# Patient Record
Sex: Female | Born: 1965 | Race: Black or African American | Hispanic: No | Marital: Married | Smoking: Never smoker
Health system: Southern US, Community
[De-identification: ages and names within clinical notes are randomized; demographics above are authoritative.]

## PROBLEM LIST (undated history)

## (undated) DIAGNOSIS — D649 Anemia, unspecified: Secondary | ICD-10-CM

## (undated) DIAGNOSIS — K625 Hemorrhage of anus and rectum: Secondary | ICD-10-CM

## (undated) DIAGNOSIS — E559 Vitamin D deficiency, unspecified: Secondary | ICD-10-CM

## (undated) DIAGNOSIS — I10 Essential (primary) hypertension: Secondary | ICD-10-CM

## (undated) DIAGNOSIS — G43909 Migraine, unspecified, not intractable, without status migrainosus: Secondary | ICD-10-CM

## (undated) HISTORY — DX: Essential (primary) hypertension: I10

## (undated) HISTORY — DX: Hemorrhage of anus and rectum: K62.5

## (undated) HISTORY — DX: Migraine, unspecified, not intractable, without status migrainosus: G43.909

## (undated) HISTORY — PX: TUBAL LIGATION: SHX77

## (undated) HISTORY — PX: OOPHORECTOMY: SHX86

## (undated) HISTORY — DX: Vitamin D deficiency, unspecified: E55.9

## (undated) HISTORY — DX: Anemia, unspecified: D64.9

---

## 2010-07-15 ENCOUNTER — Encounter (INDEPENDENT_AMBULATORY_CARE_PROVIDER_SITE_OTHER): Payer: Self-pay | Admitting: *Deleted

## 2010-07-22 ENCOUNTER — Encounter (INDEPENDENT_AMBULATORY_CARE_PROVIDER_SITE_OTHER): Payer: Self-pay | Admitting: Obstetrics and Gynecology

## 2010-07-22 ENCOUNTER — Inpatient Hospital Stay (HOSPITAL_COMMUNITY)
Admission: RE | Admit: 2010-07-22 | Discharge: 2010-07-23 | Payer: Self-pay | Source: Home / Self Care | Admitting: Obstetrics and Gynecology

## 2010-07-23 ENCOUNTER — Encounter (INDEPENDENT_AMBULATORY_CARE_PROVIDER_SITE_OTHER): Payer: Self-pay | Admitting: *Deleted

## 2010-10-21 ENCOUNTER — Encounter (INDEPENDENT_AMBULATORY_CARE_PROVIDER_SITE_OTHER): Payer: Self-pay | Admitting: *Deleted

## 2010-10-28 NOTE — Letter (Signed)
Summary: New Patient letter  [facility]  [address] [place], [place] [address]   Phone: [phone]  Fax: [phone]       10/21/2010 MRN: [id number]  Kelsey Townsend [address] [place], [place]  [address]  Dear Ms. Yaffe,  Welcome to the Gastroenterology Division at [facility].    You are scheduled to see Dr.  Christella Hartigan on 11-10-10 at 9:15A.M. on the 3rd floor at [facility], [address]  We ask that you try to arrive at our office 15 minutes prior to your appointment time to allow for check-in.  We would like you to complete the enclosed self-administered evaluation form prior to your visit and bring it with you on the day of your appointment.  We will review it with you.  Also, please bring a complete list of all your medications or, if you prefer, bring the medication bottles and we will list them.  Please bring your insurance card so that we may make a copy of it.  If your insurance requires a referral to see a specialist, please bring your referral form from your primary care physician.  Co-payments are due at the time of your visit and may be paid by cash, check or credit card.     Your office visit will consist of a consult with your physician (includes a physical exam), any laboratory testing he/she may order, scheduling of any necessary diagnostic testing (e.g. x-ray, ultrasound, CT-scan), and scheduling of a procedure (e.g. Endoscopy, Colonoscopy) if required.  Please allow enough time on your schedule to allow for any/all of these possibilities.    If you cannot keep your appointment, please call [phone] to cancel or reschedule prior to your appointment date.  This allows [place] the opportunity to schedule an appointment for another patient in need of care.  If you do not cancel or reschedule by 5 p.m. the business day prior to your appointment date, you will be charged a $50.00 late cancellation/no-show fee.    Thank you for choosing  [facility] for your medical needs.  We appreciate the opportunity to care for you.  Please visit [place] at our website  to learn more about our practice.                     Sincerely,                                                             The Gastroenterology Division

## 2010-11-10 ENCOUNTER — Ambulatory Visit: Payer: Self-pay | Admitting: Gastroenterology

## 2010-11-11 LAB — CBC
Hemoglobin: 12.4 g/dL (ref 12.0–15.0)
MCHC: 33.6 g/dL (ref 30.0–36.0)
Platelets: 199 10*3/uL (ref 150–400)
RDW: 12.9 % (ref 11.5–15.5)
RDW: 13.1 % (ref 11.5–15.5)
WBC: 12.6 10*3/uL — ABNORMAL HIGH (ref 4.0–10.5)
WBC: 8.9 10*3/uL (ref 4.0–10.5)

## 2010-11-11 LAB — SURGICAL PCR SCREEN
MRSA, PCR: NEGATIVE
Staphylococcus aureus: NEGATIVE

## 2010-11-11 LAB — PREGNANCY, URINE: Preg Test, Ur: NEGATIVE

## 2011-10-12 ENCOUNTER — Ambulatory Visit (INDEPENDENT_AMBULATORY_CARE_PROVIDER_SITE_OTHER): Payer: BC Managed Care – PPO | Admitting: General Surgery

## 2011-10-12 ENCOUNTER — Encounter (INDEPENDENT_AMBULATORY_CARE_PROVIDER_SITE_OTHER): Payer: Self-pay | Admitting: General Surgery

## 2011-10-12 DIAGNOSIS — K649 Unspecified hemorrhoids: Secondary | ICD-10-CM

## 2011-10-12 DIAGNOSIS — K648 Other hemorrhoids: Secondary | ICD-10-CM | POA: Insufficient documentation

## 2011-10-12 NOTE — Patient Instructions (Addendum)
Specifically for you: Stool softener 2/day (colace or similar generic) Fiber supplement (benefiber or metamucil) 2-3 times/day Continue proctofoam Sitz baths. 8 glasses water/day  Hemorrhoids  Hemorrhoids are enlarged (dilated) veins around the rectum. There are 2 types of hemorrhoids, and the type of hemorrhoid is determined by its location. Internal hemorrhoids occur in the veins just inside the rectum.They are usually not painful, but they may bleed.However, they may poke through to the outside and become irritated and painful. External hemorrhoids involve the veins outside the anus and can be felt as a painful swelling or hard lump near the anus.They are often itchy and may crack and bleed. Sometimes clots will form in the veins. This makes them swollen and painful. These are called thrombosed hemorrhoids. CAUSES Causes of hemorrhoids include:  Pregnancy. This increases the pressure in the hemorrhoidal veins.   Constipation.   Straining to have a bowel movement.   Obesity.   Heavy lifting or other activity that caused you to strain.   TREATMENT Most of the time hemorrhoids improve in 1 to 2 weeks. However, if symptoms do not seem to be getting better or if you have a lot of rectal bleeding, your caregiver may perform a procedure to help make the hemorrhoids get smaller or remove them completely.Possible treatments include:  Rubber band ligation. A rubber band is placed at the base of the hemorrhoid to cut off the circulation.   Sclerotherapy. A chemical is injected to shrink the hemorrhoid. This is what we did today.  Hemorrhoidectomy. This is surgical removal of the hemorrhoid.   HOME CARE INSTRUCTIONS   Increase fiber in your diet. Ask your caregiver about using fiber supplements.   Drink enough water and fluids to keep your urine clear or pale yellow.   Exercise regularly.   Go to the bathroom when you have the urge to have a bowel movement. Do not wait.   Avoid  straining to have bowel movements.   Keep the anal area dry and clean.   Only take over-the-counter or prescription medicines for pain, discomfort, or fever as directed by your caregiver.   Take warm sitz baths for 20 to 30 minutes, 3 to 4 times per day.   If the hemorrhoids are very tender and swollen, place ice packs on the area as tolerated. Using ice packs between sitz baths may be helpful. Fill a plastic bag with ice. Place a towel between the bag of ice and your skin.   Medicated creams and suppositories may be used or applied as directed.   Do not use a donut-shaped pillow or sit on the toilet for long periods. This increases blood pooling and pain.   SEEK MEDICAL CARE IF:   You have increasing pain and swelling that is not controlled with your medicine.   You have uncontrolled bleeding.   You have difficulty or you are unable to have a bowel movement.   You have pain or inflammation outside the area of the hemorrhoids.   You have chills or an oral temperature above 102 F (38.9 C).

## 2011-10-12 NOTE — Progress Notes (Signed)
Chief Complaint  Patient presents with  . Hemorrhoids    HISTORY: Pt presents with around 1 year of bleeding per rectum with bowel movements.  Originally, these episodes seemed to be clustered around her menses, but now are with every 1-2 bowel movements.  She denies hard stools and does not spend a significant amount of time on the toilet.  She had a colonoscopy by Dr. Bosie Clos that was negative other than hemorrhoids.  She has been on proctofoam once/day since the scope (around 1 month).  She has tried to change her diet to eat more fruits and vegetables, and her stools are more frequent, but are small.  She denies pain, itching, burning, or other symptoms.    Past Medical History  Diagnosis Date  . Abdominal pain   . Rectal bleeding     Past Surgical History  Procedure Date  . Oophorectomy   . Tubal ligation     No current outpatient prescriptions on file.     Allergies no known allergies   No family history on file.   History   Social History  . Marital Status: Married    Spouse Name: N/A    Number of Children: N/A  . Years of Education: N/A   Social History Main Topics  . Smoking status: Current Everyday Smoker  . Smokeless tobacco: Not on file  . Alcohol Use: No  . Drug Use: No  . Sexually Active:     REVIEW OF SYSTEMS - PERTINENT POSITIVES ONLY: 12 point review of systems negative other than HPI and PMH.  EXAM: Filed Vitals:   10/12/11 1341  BP: 108/72  Pulse: 84  Temp: 98.6 F (37 C)  Resp: 16    Gen:  No acute distress.  Well nourished and well groomed.   Neurological: Alert and oriented to person, place, and time. Coordination normal.  Head: Normocephalic and atraumatic.  Eyes: Conjunctivae are normal. Pupils are equal, round, and reactive to light. No scleral icterus.  Cardiovascular: Normal rate,  intact distal pulses.   Respiratory: Effort normal.  No respiratory distress.  GI: Soft. Bowel sounds are normal. The abdomen is soft and  nontender.  There is no rebound and no guarding.  Rectal:  Circumferential internal hemorrhoids on anoscopy, worst on right side.  Few small external tags.  Injected circumferentially. Musculoskeletal: Normal range of motion. Extremities are nontender.  Skin: Skin is warm and dry. No rash noted. No diaphoresis. No erythema. No pallor. No clubbing, cyanosis, or edema.   Psychiatric: Normal mood and affect. Behavior is normal. Judgment and thought content normal.    LABORATORY RESULTS: CBC hct 36.8   COLONOSCOPY RESULTS: Internal hemorrhoids only.  Remainder of colon clear.   ASSESSMENT AND PLAN: Hemorrhoids, internal, with bleeding Injected circumferential internal hemorrhoids. Continue proctofoam Stool softeners Sitz baths Fiber supplements Follow up in 4-6 weeks.     Kelsey Diego MD Surgical Oncology, General and Endocrine Surgery [facility]    Visit Diagnoses: 1. Hemorrhoids     Primary Care Physician: Rudi Heap, MD, MD  GI:  Dr. Bosie Clos.

## 2011-10-12 NOTE — Assessment & Plan Note (Signed)
Injected circumferential internal hemorrhoids. Continue proctofoam Stool softeners Sitz baths Fiber supplements Follow up in 4-6 weeks.

## 2011-11-19 ENCOUNTER — Encounter (INDEPENDENT_AMBULATORY_CARE_PROVIDER_SITE_OTHER): Payer: Self-pay

## 2011-11-26 ENCOUNTER — Encounter (INDEPENDENT_AMBULATORY_CARE_PROVIDER_SITE_OTHER): Payer: Self-pay | Admitting: General Surgery

## 2011-11-26 ENCOUNTER — Ambulatory Visit (INDEPENDENT_AMBULATORY_CARE_PROVIDER_SITE_OTHER): Payer: BC Managed Care – PPO | Admitting: General Surgery

## 2011-11-26 VITALS — BP 124/82 | HR 70 | Temp 97.8°F | Resp 16 | Ht 64.0 in | Wt 159.6 lb

## 2011-11-26 DIAGNOSIS — K648 Other hemorrhoids: Secondary | ICD-10-CM

## 2011-11-26 NOTE — Patient Instructions (Signed)
Avoid constipation.  If any symptoms recur, start proctofoam immediately, do sitz baths, and be more aggressive with laxatives.

## 2011-11-26 NOTE — Assessment & Plan Note (Signed)
Clinically improved. Reviewed hemorrhoid instructions with pt.   Avoid constipation. Follow up as needed.

## 2011-11-26 NOTE — Progress Notes (Signed)
HISTORY: Pt comes back after being injected 6 weeks ago.  She is no longer having bleeding, but has been found to be anemic.  She is not having burning or itching.  She is not having any prolapse.     PERTINENT REVIEW OF SYSTEMS: O/w negative.     EXAM: Head: Normocephalic and atraumatic.  Eyes:  Conjunctivae are normal. Pupils are equal, round, and reactive to light. No scleral icterus.  Neck:  Normal range of motion.   Resp: No respiratory distress, normal effort. Anorectal:  One pedunculated hemorrhoid that is non inflamed on posterior left.  Anoscopy not performed.   Neurological: Alert and oriented to person, place, and time. Coordination normal.  Skin: Skin is warm and dry. No rash noted. No diaphoretic. No erythema. No pallor.  Psychiatric: Normal mood and affect. Normal behavior. Judgment and thought content normal.      ASSESSMENT AND PLAN:   Hemorrhoids, internal, with bleeding Clinically improved. Reviewed hemorrhoid instructions with pt.   Avoid constipation. Follow up as needed.         Maudry Diego, MD Surgical Oncology, General & Endocrine Surgery [facility]  Rudi Heap, MD, MD Ernestina Penna, MD

## 2011-12-09 ENCOUNTER — Encounter (HOSPITAL_COMMUNITY): Payer: Self-pay | Admitting: *Deleted

## 2011-12-09 DIAGNOSIS — R1031 Right lower quadrant pain: Secondary | ICD-10-CM | POA: Insufficient documentation

## 2011-12-09 DIAGNOSIS — F17200 Nicotine dependence, unspecified, uncomplicated: Secondary | ICD-10-CM | POA: Insufficient documentation

## 2011-12-09 DIAGNOSIS — R10813 Right lower quadrant abdominal tenderness: Secondary | ICD-10-CM | POA: Insufficient documentation

## 2011-12-09 DIAGNOSIS — D649 Anemia, unspecified: Secondary | ICD-10-CM | POA: Insufficient documentation

## 2011-12-09 NOTE — ED Notes (Signed)
Pain in right lower quadrant intermittent, states she has had frequent problems with abdominal pain

## 2011-12-10 ENCOUNTER — Emergency Department (HOSPITAL_COMMUNITY)
Admission: EM | Admit: 2011-12-10 | Discharge: 2011-12-10 | Disposition: A | Discharge status: Home / Self Care | Payer: BC Managed Care – PPO | Attending: Emergency Medicine | Admitting: Emergency Medicine

## 2011-12-10 ENCOUNTER — Emergency Department (HOSPITAL_COMMUNITY): Payer: BC Managed Care – PPO

## 2011-12-10 LAB — URINALYSIS, ROUTINE W REFLEX MICROSCOPIC
Ketones, ur: NEGATIVE mg/dL
Leukocytes, UA: NEGATIVE
Nitrite: NEGATIVE
Protein, ur: NEGATIVE mg/dL
Urobilinogen, UA: 0.2 mg/dL (ref 0.0–1.0)

## 2011-12-10 LAB — CBC
MCH: 28.5 pg (ref 26.0–34.0)
MCHC: 33.2 g/dL (ref 30.0–36.0)
RDW: 13.4 % (ref 11.5–15.5)

## 2011-12-10 LAB — URINE MICROSCOPIC-ADD ON

## 2011-12-10 MED ORDER — MORPHINE SULFATE 2 MG/ML IJ SOLN
2.0000 mg | Freq: Once | INTRAMUSCULAR | Status: AC
  Administered 2011-12-10: 2 mg via INTRAVENOUS
  Filled 2011-12-10: qty 1

## 2011-12-10 MED ORDER — ONDANSETRON HCL 4 MG/2ML IJ SOLN
4.0000 mg | Freq: Once | INTRAMUSCULAR | Status: AC
  Administered 2011-12-10: 4 mg via INTRAVENOUS
  Filled 2011-12-10: qty 2

## 2011-12-10 MED ORDER — SODIUM CHLORIDE 0.9 % IV SOLN
Freq: Once | INTRAVENOUS | Status: AC
  Administered 2011-12-10: 02:00:00 via INTRAVENOUS

## 2011-12-10 NOTE — ED Provider Notes (Signed)
History     CSN: [id number]  Arrival date & time 12/09/11  2126   First MD Initiated Contact with Patient 12/10/11 0043      Chief Complaint  Patient presents with  . Abdominal Pain    (Consider location/radiation/quality/duration/timing/severity/associated sxs/prior treatment) HPI Kelsey Townsend is a 46 y.o. female with a h/o anemia, chronic abdominal pain, who presents to the Emergency Department complaining of right lower quadrant pain that began today. Last BM was two days ago and normal. Denies fever, chills, nausea, vomiting.    PCP Dr. Christell Constant Past Medical History  Diagnosis Date  . Abdominal pain   . Rectal bleeding   . Hemorrhoids   . Anemia     Past Surgical History  Procedure Date  . Oophorectomy   . Tubal ligation     No family history on file.  History  Substance Use Topics  . Smoking status: Current Everyday Smoker  . Smokeless tobacco: Never Used  . Alcohol Use: No    OB History    Grav Para Term Preterm Abortions TAB SAB Ect Mult Living                  Review of Systems  Constitutional: Negative for fever.       10 Systems reviewed and are negative for acute change except as noted in the HPI.  HENT: Negative for congestion.   Eyes: Negative for discharge and redness.  Respiratory: Negative for cough and shortness of breath.   Cardiovascular: Negative for chest pain.  Gastrointestinal: Positive for abdominal pain. Negative for vomiting.  Musculoskeletal: Negative for back pain.  Skin: Negative for rash.  Neurological: Negative for syncope, numbness and headaches.  Psychiatric/Behavioral:       No behavior change.    Allergies  Review of patient's allergies indicates no known allergies.  Home Medications   Current Outpatient Rx  Name Route Sig Dispense Refill  . HEMOCYTE PLUS PO Oral Take by mouth daily.    . IRON PO Oral Take by mouth daily.      BP 150/96  Pulse 71  Temp(Src) 97.9 F (36.6 C) (Oral)  Resp 20  Ht 5\' 5"   (1.651 m)  Wt 160 lb (72.576 kg)  BMI 26.63 kg/m2  SpO2 100%  LMP 11/19/2011  Physical Exam  Nursing note and vitals reviewed. Constitutional:       Awake, alert, nontoxic appearance with baseline speech for patient.  HENT:  Head: Atraumatic.  Mouth/Throat: No oropharyngeal exudate.  Eyes: EOM are normal. Pupils are equal, round, and reactive to light. Right eye exhibits no discharge. Left eye exhibits no discharge.  Neck: Neck supple.  Cardiovascular: Normal rate and regular rhythm.   No murmur heard. Pulmonary/Chest: Effort normal and breath sounds normal. No stridor. No respiratory distress. She has no wheezes. She has no rales. She exhibits no tenderness.  Abdominal: Soft. Bowel sounds are normal. She exhibits no mass. There is tenderness. There is no rebound.       Mild right lower quadrant pain to palpation.  Musculoskeletal: She exhibits no tenderness.       Baseline ROM, moves extremities with no obvious new focal weakness.  Lymphadenopathy:    She has no cervical adenopathy.  Neurological:       Awake, alert, cooperative and aware of situation; motor strength bilaterally; sensation normal to light touch bilaterally; peripheral visual fields full to confrontation; no facial asymmetry; tongue midline; major cranial nerves appear intact; no pronator drift, normal finger  to nose bilaterally, baseline gait without new ataxia.  Skin: No rash noted.  Psychiatric: She has a normal mood and affect.    ED Course  Procedures (including critical care time)  Results for orders placed during the hospital encounter of 12/10/11  URINALYSIS, ROUTINE W REFLEX MICROSCOPIC      Component Value Range   Color, Urine STRAW (*) YELLOW    APPearance CLEAR  CLEAR    Specific Gravity, Urine 1.010  1.005 - 1.030    pH 6.0  5.0 - 8.0    Glucose, UA NEGATIVE  NEGATIVE (mg/dL)   Hgb urine dipstick LARGE (*) NEGATIVE    Bilirubin Urine NEGATIVE  NEGATIVE    Ketones, ur NEGATIVE  NEGATIVE (mg/dL)     Protein, ur NEGATIVE  NEGATIVE (mg/dL)   Urobilinogen, UA 0.2  0.0 - 1.0 (mg/dL)   Nitrite NEGATIVE  NEGATIVE    Leukocytes, UA NEGATIVE  NEGATIVE   PREGNANCY, URINE      Component Value Range   Preg Test, Ur NEGATIVE  NEGATIVE   CBC      Component Value Range   WBC 7.6  4.0 - 10.5 (K/uL)   RBC 4.00  3.87 - 5.11 (MIL/uL)   Hemoglobin 11.4 (*) 12.0 - 15.0 (g/dL)   HCT 85.0 (*) 27.7 - 46.0 (%)   MCV 85.8  78.0 - 100.0 (fL)   MCH 28.5  26.0 - 34.0 (pg)   MCHC 33.2  30.0 - 36.0 (g/dL)   RDW 41.2  87.8 - 67.6 (%)   Platelets 255  150 - 400 (K/uL)  URINE MICROSCOPIC-ADD ON      Component Value Range   Squamous Epithelial / LPF RARE  RARE    WBC, UA 0-2  <3 (WBC/hpf)   RBC / HPF 0-2  <3 (RBC/hpf)   Bacteria, UA RARE  RARE    Dg Abd Acute W/chest  12/10/2011  *RADIOLOGY REPORT*  Clinical Data: Right lower quadrant abdominal pain.  ACUTE ABDOMEN SERIES (ABDOMEN 2 VIEW & CHEST 1 VIEW)  Comparison: None.  Findings: The lungs are well-aerated and clear.  There is no evidence of focal opacification, pleural effusion or pneumothorax. The cardiomediastinal silhouette is within normal limits.  The visualized bowel gas pattern is unremarkable.  Scattered stool and air are seen within the colon; there is no evidence of small bowel dilatation to suggest obstruction.  No free intra-abdominal air is identified on the provided upright view.  No acute osseous abnormalities are seen; the sacroiliac joints are unremarkable in appearance.  IMPRESSION:  1.  Unremarkable bowel gas pattern; no free intra-abdominal air seen. 2.  No acute cardiopulmonary process identified.  Original Report Authenticated By: Tonia Ghent, M.D.     1. Abdominal pain       MDM  Patient with chronic abdominal pain who presents with RLQ pain. Labs with improving anemia on iron supplements. Acute abdominal series with no acute findings. Gas and stool in the colon. Patient given IVF, analgesic, antiemetic with improvement.  Pt  feels improved after observation and/or treatment in ED.Pt stable in ED with no significant deterioration in condition.The patient appears reasonably screened and/or stabilized for discharge and I doubt any other medical condition or other [facility] requiring further screening, evaluation, or treatment in the ED at this time prior to discharge.  MDM Reviewed: nursing note and vitals Interpretation: labs and x-ray           Nicoletta Dress. Colon Branch, MD 12/16/11 1148

## 2011-12-10 NOTE — Discharge Instructions (Signed)
Your blood count on the iron is better at 11.4. Your x-rays shows a lot of gas and stool. That is the most likely cause of your abdominal pain. He has a mild laxative like MiraLax to have several bowel movements. If you continue to have abdominal pain or develop fevers and chills he developed violent vomiting and diarrhea return to the emergency room or follow up with your doctor

## 2011-12-10 NOTE — ED Notes (Signed)
Patient resting comfortably in bed; family at bedside.

## 2014-05-23 ENCOUNTER — Other Ambulatory Visit: Payer: Self-pay | Admitting: Family

## 2014-05-23 DIAGNOSIS — K641 Second degree hemorrhoids: Secondary | ICD-10-CM

## 2014-05-23 MED ORDER — HYDROCORTISONE ACETATE 25 MG RE SUPP: 25.0000 mg | Freq: Two times a day (BID) | RECTAL | Status: DC

## 2014-06-04 ENCOUNTER — Ambulatory Visit: Payer: Self-pay | Admitting: Nurse Practitioner

## 2014-08-01 ENCOUNTER — Encounter (INDEPENDENT_AMBULATORY_CARE_PROVIDER_SITE_OTHER): Payer: Self-pay

## 2014-08-01 ENCOUNTER — Ambulatory Visit (INDEPENDENT_AMBULATORY_CARE_PROVIDER_SITE_OTHER): Payer: 59 | Admitting: Family Medicine

## 2014-08-01 ENCOUNTER — Encounter: Payer: Self-pay | Admitting: Family Medicine

## 2014-08-01 VITALS — BP 147/86 | HR 74 | Temp 98.0°F | Ht 64.0 in | Wt 164.0 lb

## 2014-08-01 DIAGNOSIS — Z0000 Encounter for general adult medical examination without abnormal findings: Secondary | ICD-10-CM

## 2014-08-01 DIAGNOSIS — R030 Elevated blood-pressure reading, without diagnosis of hypertension: Secondary | ICD-10-CM

## 2014-08-01 DIAGNOSIS — IMO0001 Reserved for inherently not codable concepts without codable children: Secondary | ICD-10-CM

## 2014-08-01 NOTE — Progress Notes (Signed)
   Subjective:    Patient ID: Kelsey Townsend, female    DOB: 09/12/1965, 48 y.o.   MRN: [id number]  HPI  48-year-old female here for physical exam. Her main concern is her blood pressure. She has purchased a wrist blood pressure instrument and typically takes her blood pressure in the morning. On average her pressures of been greater than 140/90. She does have a positive family history in her mother. Of note she had her recent prescription for vision changed still is unable to read 20/20 on the Snellen chart. This gives me some concern about diabetes.    Review of Systems  Constitutional: Negative.   HENT: Negative.   Eyes: Negative.   Respiratory: Negative.   Cardiovascular: Negative.   Gastrointestinal: Negative.   Endocrine: Negative.   Genitourinary: Negative.   Hematological: Negative.   Psychiatric/Behavioral: Negative.        Objective:   Physical Exam  Constitutional: She is oriented to person, place, and time. She appears well-developed and well-nourished.  Eyes: Conjunctivae and EOM are normal.  Neck: Normal range of motion. Neck supple.  Cardiovascular: Normal rate, regular rhythm and normal heart sounds.   Pulmonary/Chest: Effort normal and breath sounds normal.  Abdominal: Soft. Bowel sounds are normal.  Musculoskeletal: Normal range of motion.  Neurological: She is alert and oriented to person, place, and time. She has normal reflexes.  Skin: Skin is warm and dry.  Psychiatric: She has a normal mood and affect. Her behavior is normal. Thought content normal.   BP 147/86 mmHg  Pulse 74  Temp(Src) 98 F (36.7 C) (Oral)  Ht 5' 4" (1.626 m)  Wt 164 lb (74.39 kg)  BMI 28.14 kg/m2  LMP 07/18/2014       Assessment & Plan:  1. Annual physical exam  - CMP14+EGFR - Lipid panel - Thyroid Panel With TSH  2. Elevated blood pressure Does not want meds.  Will let her try behavior mod and wt loss and recheck 4 months - CMP14+EGFR - Lipid panel - Thyroid Panel  With TSH 

## 2014-08-01 NOTE — Patient Instructions (Addendum)
Continue current medications. Continue good therapeutic lifestyle changes which include good diet and exercise. Fall precautions discussed with patient. If an FOBT was given today- please return it to our front desk. If you are over 46 years old - you may need Prevnar 73 or the adult Pneumonia vaccine.  Flu Shots will be available at our office starting mid- September. Please call and schedule a FLU CLINIC APPOINTMENT.      Hypertension Hypertension, commonly called high blood pressure, is when the force of blood pumping through your arteries is too strong. Your arteries are the blood vessels that carry blood from your heart throughout your body. A blood pressure reading consists of a higher number over a lower number, such as 110/72. The higher number (systolic) is the pressure inside your arteries when your heart pumps. The lower number (diastolic) is the pressure inside your arteries when your heart relaxes. Ideally you want your blood pressure below 120/80. Hypertension forces your heart to work harder to pump blood. Your arteries may become narrow or stiff. Having hypertension puts you at risk for heart disease, stroke, and other problems.  RISK FACTORS Some risk factors for high blood pressure are controllable. Others are not.  Risk factors you cannot control include:   Race. You may be at higher risk if you are African American.  Age. Risk increases with age.  Gender. Men are at higher risk than women before age 61 years. After age 57, women are at higher risk than men. Risk factors you can control include:  Not getting enough exercise or physical activity.  Being overweight.  Getting too much fat, sugar, calories, or salt in your diet.  Drinking too much alcohol. SIGNS AND SYMPTOMS Hypertension does not usually cause signs or symptoms. Extremely high blood pressure (hypertensive crisis) may cause headache, anxiety, shortness of breath, and nosebleed. DIAGNOSIS  To check if  you have hypertension, your health care provider will measure your blood pressure while you are seated, with your arm held at the level of your heart. It should be measured at least twice using the same arm. Certain conditions can cause a difference in blood pressure between your right and left arms. A blood pressure reading that is higher than normal on one occasion does not mean that you need treatment. If one blood pressure reading is high, ask your health care provider about having it checked again. TREATMENT  Treating high blood pressure includes making lifestyle changes and possibly taking medicine. Living a healthy lifestyle can help lower high blood pressure. You may need to change some of your habits. Lifestyle changes may include:  Following the DASH diet. This diet is high in fruits, vegetables, and whole grains. It is low in salt, red meat, and added sugars.  Getting at least 2 hours of brisk physical activity every week.  Losing weight if necessary.  Not smoking.  Limiting alcoholic beverages.  Learning ways to reduce stress. If lifestyle changes are not enough to get your blood pressure under control, your health care provider may prescribe medicine. You may need to take more than one. Work closely with your health care provider to understand the risks and benefits. HOME CARE INSTRUCTIONS  Have your blood pressure rechecked as directed by your health care provider.   Take medicines only as directed by your health care provider. Follow the directions carefully. Blood pressure medicines must be taken as prescribed. The medicine does not work as well when you skip doses. Skipping doses also puts you  at risk for problems.   Do not smoke.   Monitor your blood pressure at home as directed by your health care provider. SEEK MEDICAL CARE IF:   You think you are having a reaction to medicines taken.  You have recurrent headaches or feel dizzy.  You have swelling in your  ankles.  You have trouble with your vision. SEEK IMMEDIATE MEDICAL CARE IF:  You develop a severe headache or confusion.  You have unusual weakness, numbness, or feel faint.  You have severe chest or abdominal pain.  You vomit repeatedly.  You have trouble breathing. MAKE SURE YOU:   Understand these instructions.  Will watch your condition.  Will get help right away if you are not doing well or get worse. Document Released: 08/17/2005 Document Revised: 01/01/2014 Document Reviewed: 06/09/2013 [facility] Patient Information 2015 [place], [place]. This information is not intended to replace advice given to you by your health care provider. Make sure you discuss any questions you have with your health care provider.         DASH Eating Plan DASH stands for "Dietary Approaches to Stop Hypertension." The DASH eating plan is a healthy eating plan that has been shown to reduce high blood pressure (hypertension). Additional health benefits may include reducing the risk of type 2 diabetes mellitus, heart disease, and stroke. The DASH eating plan may also help with weight loss. WHAT DO I NEED TO KNOW ABOUT THE DASH EATING PLAN? For the DASH eating plan, you will follow these general guidelines:  Choose foods with a percent daily value for sodium of less than 5% (as listed on the food label).  Use salt-free seasonings or herbs instead of table salt or sea salt.  Check with your health care provider or pharmacist before using salt substitutes.  Eat lower-sodium products, often labeled as "lower sodium" or "no salt added."  Eat fresh foods.  Eat more vegetables, fruits, and low-fat dairy products.  Choose whole grains. Look for the word "whole" as the first word in the ingredient list.  Choose fish and skinless chicken or [place] more often than red meat. Limit fish, poultry, and meat to 6 oz (170 g) each day.  Limit sweets, desserts, sugars, and sugary drinks.  Choose  heart-healthy fats.  Limit cheese to 1 oz (28 g) per day.  Eat more home-cooked food and less restaurant, buffet, and fast food.  Limit fried foods.  Cook foods using methods other than frying.  Limit canned vegetables. If you do use them, rinse them well to decrease the sodium.  When eating at a restaurant, ask that your food be prepared with less salt, or no salt if possible. WHAT FOODS CAN I EAT? Seek help from a dietitian for individual calorie needs. Grains Whole grain or whole wheat bread. Brown rice. Whole grain or whole wheat pasta. Quinoa, bulgur, and whole grain cereals. Low-sodium cereals. Corn or whole wheat flour tortillas. Whole grain cornbread. Whole grain crackers. Low-sodium crackers. Vegetables Fresh or frozen vegetables (raw, steamed, roasted, or grilled). Low-sodium or reduced-sodium tomato and vegetable juices. Low-sodium or reduced-sodium tomato sauce and paste. Low-sodium or reduced-sodium canned vegetables.  Fruits All fresh, canned (in natural juice), or frozen fruits. Meat and Other Protein Products Ground beef (85% or leaner), grass-fed beef, or beef trimmed of fat. Skinless chicken or [place]. Ground chicken or [place]. Pork trimmed of fat. All fish and seafood. Eggs. Dried beans, peas, or lentils. Unsalted nuts and seeds. Unsalted canned beans. Dairy Low-fat dairy products, such  as skim or 1% milk, 2% or reduced-fat cheeses, low-fat ricotta or cottage cheese, or plain low-fat yogurt. Low-sodium or reduced-sodium cheeses. Fats and Oils Tub margarines without trans fats. Light or reduced-fat mayonnaise and salad dressings (reduced sodium). Avocado. Safflower, olive, or canola oils. Natural peanut or almond butter. Other Unsalted popcorn and pretzels. The items listed above may not be a complete list of recommended foods or beverages. Contact your dietitian for more options. WHAT FOODS ARE NOT RECOMMENDED? Grains White bread. White pasta. White rice. Refined  cornbread. Bagels and croissants. Crackers that contain trans fat. Vegetables Creamed or fried vegetables. Vegetables in a cheese sauce. Regular canned vegetables. Regular canned tomato sauce and paste. Regular tomato and vegetable juices. Fruits Dried fruits. Canned fruit in light or heavy syrup. Fruit juice. Meat and Other Protein Products Fatty cuts of meat. Ribs, chicken wings, bacon, sausage, bologna, salami, chitterlings, fatback, hot dogs, bratwurst, and packaged luncheon meats. Salted nuts and seeds. Canned beans with salt. Dairy Whole or 2% milk, cream, half-and-half, and cream cheese. Whole-fat or sweetened yogurt. Full-fat cheeses or blue cheese. Nondairy creamers and whipped toppings. Processed cheese, cheese spreads, or cheese curds. Condiments Onion and garlic salt, seasoned salt, table salt, and sea salt. Canned and packaged gravies. Worcestershire sauce. Tartar sauce. Barbecue sauce. Teriyaki sauce. Soy sauce, including reduced sodium. Steak sauce. Fish sauce. Oyster sauce. Cocktail sauce. Horseradish. Ketchup and mustard. Meat flavorings and tenderizers. Bouillon cubes. Hot sauce. Tabasco sauce. Marinades. Taco seasonings. Relishes. Fats and Oils Butter, stick margarine, lard, shortening, ghee, and bacon fat. Coconut, palm kernel, or palm oils. Regular salad dressings. Other Pickles and olives. Salted popcorn and pretzels. The items listed above may not be a complete list of foods and beverages to avoid. Contact your dietitian for more information. WHERE CAN I FIND MORE INFORMATION? National [employer], [employer], [employer] Institute: travelstabloid.com Document Released: 08/06/2011 Document Revised: 01/01/2014 Document Reviewed: 06/21/2013 [facility] Patient Information 2015 [place], [place]. This information is not intended to replace advice given to you by your health care provider. Make sure you discuss any questions you have with your health care  provider.

## 2014-08-02 LAB — CMP14+EGFR
ALT: 16 IU/L (ref 0–32)
AST: 17 IU/L (ref 0–40)
Albumin/Globulin Ratio: 1.5 (ref 1.1–2.5)
Albumin: 4.5 g/dL (ref 3.5–5.5)
Alkaline Phosphatase: 87 IU/L (ref 39–117)
BUN/Creatinine Ratio: 13 (ref 9–23)
BUN: 12 mg/dL (ref 6–24)
CALCIUM: 9.7 mg/dL (ref 8.7–10.2)
CO2: 27 mmol/L (ref 18–29)
CREATININE: 0.92 mg/dL (ref 0.57–1.00)
Chloride: 103 mmol/L (ref 97–108)
GFR calc Af Amer: 86 mL/min/{1.73_m2} (ref >59)
GFR calc non Af Amer: 75 mL/min/{1.73_m2} (ref >59)
GLOBULIN, TOTAL: 3 g/dL (ref 1.5–4.5)
GLUCOSE: 85 mg/dL (ref 65–99)
POTASSIUM: 4.5 mmol/L (ref 3.5–5.2)
SODIUM: 143 mmol/L (ref 134–144)
Total Bilirubin: 0.4 mg/dL (ref 0.0–1.2)
Total Protein: 7.5 g/dL (ref 6.0–8.5)

## 2014-08-02 LAB — THYROID PANEL WITH TSH
FREE THYROXINE INDEX: 2.4 (ref 1.2–4.9)
T3 Uptake Ratio: 26 % (ref 24–39)
T4, Total: 9.3 ug/dL (ref 4.5–12.0)
TSH: 0.513 u[IU]/mL (ref 0.450–4.500)

## 2014-08-02 LAB — LIPID PANEL
Chol/HDL Ratio: 3.1 ratio units (ref 0.0–4.4)
Cholesterol, Total: 169 mg/dL (ref 100–199)
HDL: 55 mg/dL (ref >39)
LDL Calculated: 102 mg/dL — ABNORMAL HIGH (ref 0–99)
TRIGLYCERIDES: 61 mg/dL (ref 0–149)
VLDL Cholesterol Cal: 12 mg/dL (ref 5–40)

## 2014-09-18 ENCOUNTER — Encounter: Payer: Self-pay | Admitting: Family Medicine

## 2014-09-18 ENCOUNTER — Ambulatory Visit (INDEPENDENT_AMBULATORY_CARE_PROVIDER_SITE_OTHER): Payer: 59 | Admitting: Family Medicine

## 2014-09-18 VITALS — BP 161/102 | HR 100 | Temp 97.4°F | Ht 64.0 in | Wt 165.0 lb

## 2014-09-18 DIAGNOSIS — G44209 Tension-type headache, unspecified, not intractable: Secondary | ICD-10-CM

## 2014-09-18 MED ORDER — BUTALBITAL-APAP-CAFFEINE 50-325-40 MG PO TABS
1.0000 | ORAL_TABLET | Freq: Four times a day (QID) | ORAL | Status: AC | PRN
Start: 2014-09-18 — End: 2015-09-18

## 2014-09-18 NOTE — Progress Notes (Signed)
   Subjective:    Patient ID: Kelsey Townsend, female    DOB: 11/12/1965, 49 y.o.   MRN: [id number]  HPI Patient is having severe headache that is wrapped around her head initially and now is more in the frontal region and is pulsatile and severe.  She has phonophotophobia and nausea.  Review of Systems  Constitutional: Negative for fever.  HENT: Negative for ear pain.   Eyes: Negative for discharge.  Respiratory: Negative for cough.   Cardiovascular: Negative for chest pain.  Gastrointestinal: Negative for abdominal distention.  Endocrine: Negative for polyuria.  Genitourinary: Negative for difficulty urinating.  Musculoskeletal: Negative for gait problem and neck pain.  Skin: Negative for color change and rash.  Neurological: Negative for speech difficulty and headaches.  Psychiatric/Behavioral: Negative for agitation.       Objective:    BP 161/102 mmHg  Pulse 100  Temp(Src) 97.4 F (36.3 C) (Oral)  Ht 5\' 4"  (1.626 m)  Wt 165 lb (74.844 kg)  BMI 28.31 kg/m2  LMP 07/19/2014 Physical Exam  Constitutional: She is oriented to person, place, and time. She appears well-developed and well-nourished.  HENT:  Head: Normocephalic and atraumatic.  Mouth/Throat: Oropharynx is clear and moist.  Eyes: Pupils are equal, round, and reactive to light.  Neck: Normal range of motion. Neck supple.  Cardiovascular: Normal rate and regular rhythm.   No murmur heard. Pulmonary/Chest: Effort normal and breath sounds normal.  Abdominal: Soft. Bowel sounds are normal. There is no tenderness.  Neurological: She is alert and oriented to person, place, and time.  Skin: Skin is warm and dry.  Psychiatric: She has a normal mood and affect.          Assessment & Plan:     ICD-9-CM ICD-10-CM   1. Tension-type headache, not intractable, unspecified chronicity pattern 339.10 G44.209 butalbital-acetaminophen-caffeine (FIORICET) 50-325-40 MG per tablet   Follow up if not better.  Follow up  and get BP checked.  No Follow-up on file.  Lysbeth Penner

## 2014-10-12 ENCOUNTER — Telehealth: Payer: Self-pay | Admitting: Family Medicine

## 2014-10-12 NOTE — Telephone Encounter (Signed)
Patient's blood pressure is still elevated. She wanted to know if she can come in sooner than her scheduled for 4 month f/u. Moved appt up. Advised patient to keep a log of her blood pressure and to follow up sooner if necessary.

## 2014-10-22 ENCOUNTER — Telehealth: Payer: Self-pay | Admitting: Family Medicine

## 2014-10-25 NOTE — Telephone Encounter (Signed)
Begin amlodipine 5 mg qd #30

## 2014-10-26 ENCOUNTER — Ambulatory Visit (INDEPENDENT_AMBULATORY_CARE_PROVIDER_SITE_OTHER): Payer: 59 | Admitting: Family Medicine

## 2014-10-26 ENCOUNTER — Encounter (INDEPENDENT_AMBULATORY_CARE_PROVIDER_SITE_OTHER): Payer: Self-pay

## 2014-10-26 ENCOUNTER — Encounter: Payer: Self-pay | Admitting: Family Medicine

## 2014-10-26 VITALS — BP 150/91 | HR 92 | Temp 97.0°F | Ht 64.0 in | Wt 165.0 lb

## 2014-10-26 DIAGNOSIS — G43909 Migraine, unspecified, not intractable, without status migrainosus: Secondary | ICD-10-CM | POA: Insufficient documentation

## 2014-10-26 DIAGNOSIS — G43119 Migraine with aura, intractable, without status migrainosus: Secondary | ICD-10-CM

## 2014-10-26 MED ORDER — LOSARTAN POTASSIUM-HCTZ 50-12.5 MG PO TABS: 1.0000 | ORAL_TABLET | Freq: Every day | ORAL | Status: DC

## 2014-10-26 NOTE — Progress Notes (Signed)
   Subjective:    Patient ID: Kelsey Townsend, female    DOB: 12/17/1965, 49 y.o.   MRN: [id number]  HPI  49 year old African-American female with headaches and hypertension. At her last visit hypertension was noted but we mutually agreed to hold off treatment and let her try behavior modification lifestyle changes to lower pressure. Since that time she has gone to the emergency room with headache and was told her blood pressure was elevated. MRI at the emergency room was negative and she was given Fioricet for headache and told to follow-up regarding her blood pressure. As is always the case is hard to know which comes first headache or high blood pressure.  She denies any decrease increase in stress although she does admit to some strife at work with other women. This may or may not be related to her headaches. Other relationships are good. Sounds like she may be menopausal having not had a period in 6 months and having some hot feelings.  She is still reluctant to take any medication chronically stating that she cannot remember to take meds on a regular basis.  Patient Active Problem List   Diagnosis Date Noted  . Migraines   . Hemorrhoids, internal, with bleeding 10/12/2011   Outpatient Encounter Prescriptions as of 10/26/2014  Medication Sig  . butalbital-acetaminophen-caffeine (FIORICET) 50-325-40 MG per tablet Take 1-2 tablets by mouth every 6 (six) hours as needed for headache.     Review of Systems  Constitutional: Negative.   Eyes: Negative.   Respiratory: Negative.   Cardiovascular: Negative.   Gastrointestinal: Negative.   Endocrine: Negative.   Genitourinary: Negative.   Neurological: Positive for headaches.  Hematological: Negative.   Psychiatric/Behavioral: Negative.        Objective:   Physical Exam  Constitutional: She is oriented to person, place, and time.  Cardiovascular: Normal rate and regular rhythm.   Pulmonary/Chest: Effort normal and breath sounds  normal.  Neurological: She is alert and oriented to person, place, and time. She has normal reflexes.  Psychiatric: She has a normal mood and affect. Her behavior is normal. Judgment and thought content normal.    BP 150/91 mmHg  Pulse 92  Temp(Src) 97 F (36.1 C) (Oral)  Ht 5\' 4"  (1.626 m)  Wt 165 lb (74.844 kg)  BMI 28.31 kg/m2        Assessment & Plan:  1. Intractable migraine with aura without status migrainosus Will treat BP.  Pt does not seem happy to begin treatment.  She asks why BP has gone up now. Plan: Hyzaar, 50/12.5 Recheck in 2-3 weeks  Wardell Honour MD

## 2014-10-29 NOTE — Telephone Encounter (Signed)
Stp, she is aware and has already started taking the amlodipine. She has follow up appt in 2-3 weeks with Dr.Miller.

## 2014-11-16 ENCOUNTER — Encounter: Payer: Self-pay | Admitting: Family Medicine

## 2014-11-16 ENCOUNTER — Ambulatory Visit (INDEPENDENT_AMBULATORY_CARE_PROVIDER_SITE_OTHER): Payer: 59 | Admitting: Family Medicine

## 2014-11-16 VITALS — BP 114/66 | HR 90 | Temp 98.6°F | Ht 64.0 in | Wt 163.0 lb

## 2014-11-16 DIAGNOSIS — I10 Essential (primary) hypertension: Secondary | ICD-10-CM | POA: Insufficient documentation

## 2014-11-16 DIAGNOSIS — G43119 Migraine with aura, intractable, without status migrainosus: Secondary | ICD-10-CM

## 2014-11-16 MED ORDER — LOSARTAN POTASSIUM-HCTZ 50-12.5 MG PO TABS: 1.0000 | ORAL_TABLET | Freq: Every day | ORAL | Status: DC

## 2014-11-16 NOTE — Progress Notes (Signed)
   Subjective:    Patient ID: Kelsey Townsend, female    DOB: Aug 15, 1966, 49 y.o.   MRN: [id number]  HPI patient has responded nicely to losartan and hydrochlorothiazide. Blood pressure is perfect today and she has had no further headache. There are still issues at work but without the headache she is better able to deal with the personalities that create work place problems.  She is also complaining of knee pain. There is no history of injury. She has lots of steps that she goes up and down over the course of the day. There is some swelling.  Patient Active Problem List   Diagnosis Date Noted  . Hypertension   . Migraines   . Hemorrhoids, internal, with bleeding 10/12/2011   Outpatient Encounter Prescriptions as of 11/16/2014  Medication Sig  . butalbital-acetaminophen-caffeine (FIORICET) 50-325-40 MG per tablet Take 1-2 tablets by mouth every 6 (six) hours as needed for headache.  . losartan-hydrochlorothiazide (HYZAAR) 50-12.5 MG per tablet Take 1 tablet by mouth daily.      Review of Systems  Musculoskeletal: Positive for arthralgias.  Neurological: Positive for headaches.       Objective:   Physical Exam  Constitutional: She is oriented to person, place, and time. She appears well-developed and well-nourished.  Cardiovascular: Normal rate and regular rhythm.   Pulmonary/Chest: Effort normal.  Musculoskeletal:  Knee exam: There is some swelling in the right knee. Knees are stable to stress test. There is a slight bit of crepitance with extension and flexion  Neurological: She is alert and oriented to person, place, and time.     BP 114/66 mmHg  Pulse 90  Temp(Src) 98.6 F (37 C) (Oral)  Ht 5\' 4"  (1.626 m)  Wt 163 lb (73.936 kg)  BMI 27.97 kg/m2      Assessment & Plan:  1. Intractable migraine with aura without status migrainosus No further headache since antihypertensive medicines started. This makes me think her headaches were related to the blood pressure  possibly.  2. Hypertension Continue with Hyzaar. As well tolerated and her blood pressure is great  Wardell Honour MD

## 2014-11-22 ENCOUNTER — Ambulatory Visit: Payer: 59 | Admitting: Family Medicine

## 2015-05-21 ENCOUNTER — Ambulatory Visit (INDEPENDENT_AMBULATORY_CARE_PROVIDER_SITE_OTHER): Payer: 59 | Admitting: Family Medicine

## 2015-05-21 ENCOUNTER — Encounter: Payer: Self-pay | Admitting: Family Medicine

## 2015-05-21 VITALS — BP 150/89 | HR 84 | Temp 98.0°F | Ht 64.0 in | Wt 165.0 lb

## 2015-05-21 DIAGNOSIS — D239 Other benign neoplasm of skin, unspecified: Secondary | ICD-10-CM | POA: Insufficient documentation

## 2015-05-21 DIAGNOSIS — I10 Essential (primary) hypertension: Secondary | ICD-10-CM

## 2015-05-21 MED ORDER — VALSARTAN-HYDROCHLOROTHIAZIDE 160-12.5 MG PO TABS: 1.0000 | ORAL_TABLET | Freq: Every day | ORAL | Status: DC

## 2015-05-21 NOTE — Patient Instructions (Signed)
Monitor your blood pressure at home. Call if blood pressure is staying higher than 130/80  Low-Sodium Eating Plan Sodium raises blood pressure and causes water to be held in the body. Getting less sodium from food will help lower your blood pressure, reduce any swelling, and protect your heart, liver, and kidneys. We get sodium by adding salt (sodium chloride) to food. Most of our sodium comes from canned, boxed, and frozen foods. Restaurant foods, fast foods, and pizza are also very high in sodium. Even if you take medicine to lower your blood pressure or to reduce fluid in your body, getting less sodium from your food is important. WHAT IS MY PLAN? Most people should limit their sodium intake to 2,300 mg a day. Your health care provider recommends that you limit your sodium intake to __________ a day.  WHAT DO I NEED TO KNOW ABOUT THIS EATING PLAN? For the low-sodium eating plan, you will follow these general guidelines:  Choose foods with a % Daily Value for sodium of less than 5% (as listed on the food label).   Use salt-free seasonings or herbs instead of table salt or sea salt.   Check with your health care provider or pharmacist before using salt substitutes.   Eat fresh foods.  Eat more vegetables and fruits.  Limit canned vegetables. If you do use them, rinse them well to decrease the sodium.   Limit cheese to 1 oz (28 g) per day.   Eat lower-sodium products, often labeled as "lower sodium" or "no salt added."  Avoid foods that contain monosodium glutamate (MSG). MSG is sometimes added to [place] food and some canned foods.  Check food labels (Nutrition Facts labels) on foods to learn how much sodium is in one serving.  Eat more home-cooked food and less restaurant, buffet, and fast food.  When eating at a restaurant, ask that your food be prepared with less salt or none, if possible.  HOW DO I READ FOOD LABELS FOR SODIUM INFORMATION? The Nutrition Facts label  lists the amount of sodium in one serving of the food. If you eat more than one serving, you must multiply the listed amount of sodium by the number of servings. Food labels may also identify foods as:  Sodium free--Less than 5 mg in a serving.  Very low sodium--35 mg or less in a serving.  Low sodium--140 mg or less in a serving.  Light in sodium--50% less sodium in a serving. For example, if a food that usually has 300 mg of sodium is changed to become light in sodium, it will have 150 mg of sodium.  Reduced sodium--25% less sodium in a serving. For example, if a food that usually has 400 mg of sodium is changed to reduced sodium, it will have 300 mg of sodium. WHAT FOODS CAN I EAT? Grains Low-sodium cereals, including oats, puffed wheat and rice, and shredded wheat cereals. Low-sodium crackers. Unsalted rice and pasta. Lower-sodium bread.  Vegetables Frozen or fresh vegetables. Low-sodium or reduced-sodium canned vegetables. Low-sodium or reduced-sodium tomato sauce and paste. Low-sodium or reduced-sodium tomato and vegetable juices.  Fruits Fresh, frozen, and canned fruit. Fruit juice.  Meat and Other Protein Products Low-sodium canned tuna and salmon. Fresh or frozen meat, poultry, seafood, and fish. Lamb. Unsalted nuts. Dried beans, peas, and lentils without added salt. Unsalted canned beans. Homemade soups without salt. Eggs.  Dairy Milk. Soy milk. Ricotta cheese. Low-sodium or reduced-sodium cheeses. Yogurt.  Condiments Fresh and dried herbs and spices. Salt-free seasonings.  Onion and garlic powders. Low-sodium varieties of mustard and ketchup. Lemon juice.  Fats and Oils Reduced-sodium salad dressings. Unsalted butter.  Other Unsalted popcorn and pretzels.  The items listed above may not be a complete list of recommended foods or beverages. Contact your dietitian for more options. WHAT FOODS ARE NOT RECOMMENDED? Grains Instant hot cereals. Bread stuffing, pancake,  and biscuit mixes. Croutons. Seasoned rice or pasta mixes. Noodle soup cups. Boxed or frozen macaroni and cheese. Self-rising flour. Regular salted crackers. Vegetables Regular canned vegetables. Regular canned tomato sauce and paste. Regular tomato and vegetable juices. Frozen vegetables in sauces. Salted french fries. Olives. Angie Fava. Relishes. Sauerkraut. Salsa. Meat and Other Protein Products Salted, canned, smoked, spiced, or pickled meats, seafood, or fish. Bacon, ham, sausage, hot dogs, corned beef, chipped beef, and packaged luncheon meats. Salt pork. Jerky. Pickled herring. Anchovies, regular canned tuna, and sardines. Salted nuts. Dairy Processed cheese and cheese spreads. Cheese curds. Blue cheese and cottage cheese. Buttermilk.  Condiments Onion and garlic salt, seasoned salt, table salt, and sea salt. Canned and packaged gravies. Worcestershire sauce. Tartar sauce. Barbecue sauce. Teriyaki sauce. Soy sauce, including reduced sodium. Steak sauce. Fish sauce. Oyster sauce. Cocktail sauce. Horseradish. Regular ketchup and mustard. Meat flavorings and tenderizers. Bouillon cubes. Hot sauce. Tabasco sauce. Marinades. Taco seasonings. Relishes. Fats and Oils Regular salad dressings. Salted butter. Margarine. Ghee. Bacon fat.  Other Potato and tortilla chips. Corn chips and puffs. Salted popcorn and pretzels. Canned or dried soups. Pizza. Frozen entrees and pot pies.  The items listed above may not be a complete list of foods and beverages to avoid. Contact your dietitian for more information. Document Released: 02/06/2002 Document Revised: 08/22/2013 Document Reviewed: 06/21/2013 [facility] Patient Information 2015 [place], [place]. This information is not intended to replace advice given to you by your health care provider. Make sure you discuss any questions you have with your health care provider.

## 2015-05-21 NOTE — Progress Notes (Signed)
   Subjective:    Patient ID: Kelsey Townsend, female    DOB: 1966/05/18, 49 y.o.   MRN: [id number]  HPI  67-year-old female with hypertension. She is here today for routine follow-up blood pressure here is 150/89. When she checks it at home with her wrist instrument pressures are always between 462 and 7:03 systolic and below 80 diastolic. She admits that she sometimes forgets to take her pills. Also, she is on losartan which is probably not a true 24 hour pill.  Patient Active Problem List   Diagnosis Date Noted  . Hypertension   . Migraines   . Hemorrhoids, internal, with bleeding 10/12/2011   Outpatient Encounter Prescriptions as of 05/21/2015  Medication Sig  . butalbital-acetaminophen-caffeine (FIORICET) 50-325-40 MG per tablet Take 1-2 tablets by mouth every 6 (six) hours as needed for headache.  . losartan-hydrochlorothiazide (HYZAAR) 50-12.5 MG per tablet Take 1 tablet by mouth daily.   No facility-administered encounter medications on file as of 05/21/2015.       Review of Systems  Constitutional: Negative.   HENT: Negative.   Eyes: Negative.   Respiratory: Negative.   Cardiovascular: Negative.   Gastrointestinal: Negative.   Endocrine: Negative.   Genitourinary: Negative.   Hematological: Negative.   Psychiatric/Behavioral: Negative.        Objective:   Physical Exam  Constitutional: She is oriented to person, place, and time. She appears well-developed and well-nourished.  Cardiovascular: Normal rate and regular rhythm.   Pulmonary/Chest: Effort normal and breath sounds normal.  Neurological: She is alert and oriented to person, place, and time.  Skin:  Skin papilloma on left anterior shoulder patient would like to have removed. This was anesthetized with 1% Xylocaine skin prepped with Betadine and with sharp scissor dissection removed. Hemostasis obtained with Monsel solution     BP 150/89 mmHg  Pulse 84  Temp(Src) 98 F (36.7 C) (Oral)  Ht 5\' 4"  (1.626  m)  Wt 165 lb (74.844 kg)  BMI 28.31 kg/m2      Assessment & Plan:   1. Essential hypertension Blood pressures have been okay at home but my preference is to move her to a 24 hour pill. After current losartan runs out begin valsartan 120 mg and hydrochlorothiazide 12.5 and continue to monitor blood pressure  2. Benign skin papilloma Removed with sharp dissection as stated above.

## 2015-08-23 LAB — HM MAMMOGRAPHY

## 2015-10-31 ENCOUNTER — Other Ambulatory Visit: Payer: Self-pay | Admitting: Family Medicine

## 2015-11-13 ENCOUNTER — Telehealth: Payer: Self-pay | Admitting: Family Medicine

## 2015-11-13 NOTE — Telephone Encounter (Signed)
Patient wants to know if you could do a cortisone shot in her right knee. Please advise

## 2015-11-14 ENCOUNTER — Encounter: Payer: Self-pay | Admitting: Family Medicine

## 2015-11-14 ENCOUNTER — Ambulatory Visit (INDEPENDENT_AMBULATORY_CARE_PROVIDER_SITE_OTHER): Payer: BLUE CROSS/BLUE SHIELD | Admitting: Family Medicine

## 2015-11-14 VITALS — BP 137/82 | HR 65 | Temp 97.1°F | Ht 64.0 in | Wt 166.0 lb

## 2015-11-14 DIAGNOSIS — M179 Osteoarthritis of knee, unspecified: Secondary | ICD-10-CM | POA: Diagnosis not present

## 2015-11-14 DIAGNOSIS — M1711 Unilateral primary osteoarthritis, right knee: Secondary | ICD-10-CM | POA: Insufficient documentation

## 2015-11-14 NOTE — Patient Instructions (Signed)
Thank you for allowing us to care for you today. We strive to provide exceptional quality and compassionate care. Please let us know how we are doing and how we can help serve you better by filling out the survey that you receive from [employer].     

## 2015-11-14 NOTE — Telephone Encounter (Signed)
Yes we can do shots here but it needs to be at least 3 or 4 months since she received her last one

## 2015-11-14 NOTE — Telephone Encounter (Signed)
Patients last one was 6 mnths ago appointment given for 10:45 today.

## 2015-11-14 NOTE — Progress Notes (Signed)
Patient ID: Kelsey Townsend, female   DOB: [birth year], 50 y.o.   MRN: [id number]  Primary Physician: Wardell Honour, MD  Chief Complaint: 50 year old female with degenerative arthritis. In her younger years she did gymnastics and cheerleading has been told by orthopedics at her cartilages gone. She now has swelling and pain knee clicks. She has had some relief with steroid injections in the past. She is aware that walking aggravates it so exercises on recumbent bike. She is in the process of trying to lose some weight and not having much luck.     Past Medical History  Diagnosis Date  . Abdominal pain   . Rectal bleeding   . Hemorrhoids   . Anemia   . Migraines   . Hypertension   . Vitamin D deficiency disease      Home Meds: Prior to Admission medications   Medication Sig Start Date End Date Taking? Authorizing Provider  cholecalciferol (VITAMIN D) 1000 units tablet Take 1,000 Units by mouth daily.   Yes Historical Provider, MD  valsartan-hydrochlorothiazide (DIOVAN-HCT) 160-12.5 MG tablet TAKE 1 TABLET BY MOUTH DAILY. 10/31/15  Yes Wardell Honour, MD  Vitamin D, Ergocalciferol, (DRISDOL) 50000 units CAPS capsule Take 1 capsule by mouth daily. 10/03/15   Historical Provider, MD    Allergies: No Known Allergies  Social History   Social History  . Marital Status: Married    Spouse Name: N/A  . Number of Children: N/A  . Years of Education: N/A   Occupational History  . Not on file.   Social History Main Topics  . Smoking status: Never Smoker   . Smokeless tobacco: Never Used  . Alcohol Use: No  . Drug Use: No  . Sexual Activity: Not on file   Other Topics Concern  . Not on file   Social History Narrative     Review of Systems: Constitutional: negative for chills, fever, night sweats, weight changes, or fatigue  HEENT: negative for vision changes, hearing loss, congestion, rhinorrhea, ST, epistaxis, or sinus pressure Cardiovascular: negative for chest pain or  palpitations Respiratory: negative for hemoptysis, wheezing, shortness of breath, or cough Abdominal: negative for abdominal pain, nausea, vomiting, diarrhea, or constipation Dermatological: negative for rash Neurologic: negative for headache, dizziness, or syncope Musculoskeletal: Pain in her right knee with swelling and grinding. All other systems reviewed and are otherwise negative with the exception to those above and in the HPI.   Physical Exam: Blood pressure 137/82, pulse 65, temperature 97.1 F (36.2 C), temperature source Oral, height 5\' 4"  (1.626 m), weight 166 lb (75.297 kg)., Body mass index is 28.48 kg/(m^2). General: Well developed, well nourished, in no acute distress. Head: Normocephalic, atraumatic, eyes without discharge, sclera non-icteric, nares are without discharge. Bilateral auditory canals clear, TM's are without perforation, pearly grey and translucent with reflective cone of light bilaterally. Oral cavity moist, posterior pharynx without exudate, erythema, peritonsillar abscess, or post nasal drip.  Neck: Supple. No thyromegaly. Full ROM. No lymphadenopathy. Lungs: Clear bilaterally to auscultation without wheezes, rales, or rhonchi. Breathing is unlabored. Heart: RRR with S1 S2. No murmurs, rubs, or gallops appreciated. Abdomen: Soft, non-tender, non-distended with normoactive bowel sounds. No hepatomegaly. No rebound/guarding. No obvious abdominal masses. Msk:  Right knee was prepped with alcohol and injected using a lateral approach with Depo-Medrol and Marcaine. Patient tolerated this well . Extremities/Skin: Warm and dry. No clubbing or cyanosis. No edema. No rashes or suspicious lesions. Neuro: Alert and oriented X 3. Moves all extremities spontaneously. Gait  is normal. CNII-XII grossly in tact. Psych:  Responds to questions appropriately with a normal affect.   Labs:   ASSESSMENT AND PLAN:   1. Osteoarthritis of right knee, unspecified osteoarthritis  type Patient to continue modified exercise program. Recommended glucosamine. Continue to work on weight. Will do periodic injections as long as they're helpful. Wardell Honour MD  11/14/2015 11:17 AM

## 2015-11-15 ENCOUNTER — Telehealth: Payer: Self-pay | Admitting: Family Medicine

## 2015-11-15 ENCOUNTER — Other Ambulatory Visit: Payer: Self-pay | Admitting: *Deleted

## 2015-11-15 ENCOUNTER — Ambulatory Visit: Payer: BLUE CROSS/BLUE SHIELD | Admitting: Family Medicine

## 2015-11-15 MED ORDER — HYDROCODONE-ACETAMINOPHEN 5-325 MG PO TABS: 1.0000 | ORAL_TABLET | Freq: Four times a day (QID) | ORAL | Status: DC | PRN

## 2015-11-15 NOTE — Telephone Encounter (Signed)
Patient states that since cortisone shot her knee has been swollen and she is unable to walk on it. Patient is icing and keeping it elevated. Please advise. Patient also needs a note for work today.

## 2015-11-15 NOTE — Telephone Encounter (Signed)
Patient came by due to swelling and pain since right knee injection yesterday. She is mobile and there are no signs if infection - per SM. We will give her an RX AND NOTE FOR WORK  RETURN Monday IF NO BETTER.

## 2015-11-15 NOTE — Telephone Encounter (Signed)
Patient is coming for him to look at knee.

## 2015-11-15 NOTE — Telephone Encounter (Signed)
Okay for work note for today. I will be glad to look at her knee if she can get here.

## 2015-11-18 ENCOUNTER — Encounter: Payer: Self-pay | Admitting: Family Medicine

## 2015-11-19 ENCOUNTER — Ambulatory Visit: Payer: 59 | Admitting: Family Medicine

## 2015-12-18 ENCOUNTER — Encounter (INDEPENDENT_AMBULATORY_CARE_PROVIDER_SITE_OTHER): Payer: Self-pay

## 2015-12-18 ENCOUNTER — Encounter: Payer: Self-pay | Admitting: Family Medicine

## 2015-12-18 ENCOUNTER — Ambulatory Visit (INDEPENDENT_AMBULATORY_CARE_PROVIDER_SITE_OTHER): Payer: BLUE CROSS/BLUE SHIELD | Admitting: Family Medicine

## 2015-12-18 VITALS — Temp 97.0°F | Ht 64.0 in | Wt 167.2 lb

## 2015-12-18 DIAGNOSIS — Z0000 Encounter for general adult medical examination without abnormal findings: Secondary | ICD-10-CM | POA: Diagnosis not present

## 2015-12-18 NOTE — Progress Notes (Signed)
   Subjective:    Patient ID: Kelsey Townsend, female    DOB: 09/15/1965, 50 y.o.   MRN: [id number]  HPI 50 year old female here for physical today. She has a gynecologist in [place] and her last Pap smear and mammogram were September 2016. Her only complaint is pain in her right knee. Blood pressure is up today but she has been out of her blood pressure medicine.  Patient Active Problem List   Diagnosis Date Noted  . Degenerative arthritis of right knee 11/14/2015  . Benign skin papilloma 05/21/2015  . Hypertension   . Migraines   . Hemorrhoids, internal, with bleeding 10/12/2011   Outpatient Encounter Prescriptions as of 12/18/2015  Medication Sig  . cholecalciferol (VITAMIN D) 1000 units tablet Take 1,000 Units by mouth daily.  Marland Kitchen HYDROcodone-acetaminophen (NORCO/VICODIN) 5-325 MG tablet Take 1 tablet by mouth every 6 (six) hours as needed for moderate pain.  . valsartan-hydrochlorothiazide (DIOVAN-HCT) 160-12.5 MG tablet TAKE 1 TABLET BY MOUTH DAILY.  Marland Kitchen Vitamin D, Ergocalciferol, (DRISDOL) 50000 units CAPS capsule Take 1 capsule by mouth daily.   No facility-administered encounter medications on file as of 12/18/2015.      Review of Systems  Constitutional: Negative.   HENT: Negative.   Eyes: Negative.   Respiratory: Negative.   Cardiovascular: Negative.   Gastrointestinal: Negative.   Endocrine: Negative.   Genitourinary: Negative.   Musculoskeletal: Positive for arthralgias.  Hematological: Negative.   Psychiatric/Behavioral: Negative.        Objective:   Physical Exam  Constitutional: She is oriented to person, place, and time. She appears well-developed and well-nourished.  HENT:  Head: Normocephalic.  Right Ear: External ear normal.  Left Ear: External ear normal.  Mouth/Throat: Oropharynx is clear and moist.  Eyes: Pupils are equal, round, and reactive to light.  Neck: Normal range of motion.  Cardiovascular: Normal rate, regular rhythm, normal heart  sounds and intact distal pulses.   Pulmonary/Chest: Effort normal and breath sounds normal.  Abdominal: Soft.  Musculoskeletal: She exhibits no edema.  Crepitance is present in the right knee with flexion and extension  Neurological: She is alert and oriented to person, place, and time.          Assessment & Plan:  1. Annual physical exam Exam is within normal limits except for changes in the right knee and mildly overweight. I did suggest trial of glucosamine for 1-2 months with Aleve. - CBC with Differential/Platelet - CMP14+EGFR - Lipid panel - TSH - VITAMIN D 25 Hydroxy (Vit-D Deficiency, Fractures)  Wardell Honour MD

## 2015-12-19 LAB — CMP14+EGFR
ALBUMIN: 4.5 g/dL (ref 3.5–5.5)
ALT: 17 IU/L (ref 0–32)
AST: 15 IU/L (ref 0–40)
Albumin/Globulin Ratio: 1.7 (ref 1.2–2.2)
Alkaline Phosphatase: 79 IU/L (ref 39–117)
BUN / CREAT RATIO: 26 — AB (ref 9–23)
BUN: 20 mg/dL (ref 6–24)
Bilirubin Total: <0.2 mg/dL (ref 0.0–1.2)
CO2: 24 mmol/L (ref 18–29)
CREATININE: 0.78 mg/dL (ref 0.57–1.00)
Calcium: 9.4 mg/dL (ref 8.7–10.2)
Chloride: 102 mmol/L (ref 96–106)
GFR calc non Af Amer: 91 mL/min/{1.73_m2} (ref >59)
GFR, EST AFRICAN AMERICAN: 105 mL/min/{1.73_m2} (ref >59)
GLOBULIN, TOTAL: 2.6 g/dL (ref 1.5–4.5)
GLUCOSE: 82 mg/dL (ref 65–99)
Potassium: 4.7 mmol/L (ref 3.5–5.2)
Sodium: 142 mmol/L (ref 134–144)
TOTAL PROTEIN: 7.1 g/dL (ref 6.0–8.5)

## 2015-12-19 LAB — CBC WITH DIFFERENTIAL/PLATELET
BASOS ABS: 0 10*3/uL (ref 0.0–0.2)
Basos: 1 %
EOS (ABSOLUTE): 0.1 10*3/uL (ref 0.0–0.4)
EOS: 2 %
HEMATOCRIT: 37.5 % (ref 34.0–46.6)
HEMOGLOBIN: 12.1 g/dL (ref 11.1–15.9)
IMMATURE GRANS (ABS): 0 10*3/uL (ref 0.0–0.1)
Immature Granulocytes: 0 %
LYMPHS: 42 %
Lymphocytes Absolute: 2.6 10*3/uL (ref 0.7–3.1)
MCH: 28.7 pg (ref 26.6–33.0)
MCHC: 32.3 g/dL (ref 31.5–35.7)
MCV: 89 fL (ref 79–97)
MONOCYTES: 6 %
Monocytes Absolute: 0.4 10*3/uL (ref 0.1–0.9)
NEUTROS ABS: 3 10*3/uL (ref 1.4–7.0)
Neutrophils: 49 %
Platelets: 253 10*3/uL (ref 150–379)
RBC: 4.21 x10E6/uL (ref 3.77–5.28)
RDW: 13.6 % (ref 12.3–15.4)
WBC: 6.1 10*3/uL (ref 3.4–10.8)

## 2015-12-19 LAB — TSH: TSH: 0.641 u[IU]/mL (ref 0.450–4.500)

## 2015-12-19 LAB — LIPID PANEL
CHOL/HDL RATIO: 3.3 ratio (ref 0.0–4.4)
Cholesterol, Total: 164 mg/dL (ref 100–199)
HDL: 49 mg/dL (ref >39)
LDL CALC: 99 mg/dL (ref 0–99)
Triglycerides: 81 mg/dL (ref 0–149)
VLDL CHOLESTEROL CAL: 16 mg/dL (ref 5–40)

## 2015-12-19 LAB — VITAMIN D 25 HYDROXY (VIT D DEFICIENCY, FRACTURES): Vit D, 25-Hydroxy: 32.2 ng/mL (ref 30.0–100.0)

## 2016-01-02 ENCOUNTER — Encounter: Payer: Self-pay | Admitting: *Deleted

## 2016-06-25 ENCOUNTER — Ambulatory Visit: Payer: BLUE CROSS/BLUE SHIELD | Admitting: Family Medicine

## 2016-06-26 ENCOUNTER — Encounter: Payer: Self-pay | Admitting: Family Medicine

## 2016-10-06 ENCOUNTER — Ambulatory Visit (INDEPENDENT_AMBULATORY_CARE_PROVIDER_SITE_OTHER): Payer: 59 | Admitting: Family Medicine

## 2016-10-06 ENCOUNTER — Encounter: Payer: Self-pay | Admitting: Family Medicine

## 2016-10-06 VITALS — BP 129/81 | HR 81 | Temp 98.5°F | Ht 64.0 in | Wt 168.0 lb

## 2016-10-06 DIAGNOSIS — R319 Hematuria, unspecified: Secondary | ICD-10-CM | POA: Diagnosis not present

## 2016-10-06 DIAGNOSIS — I10 Essential (primary) hypertension: Secondary | ICD-10-CM | POA: Diagnosis not present

## 2016-10-06 LAB — URINALYSIS
Bilirubin, UA: NEGATIVE
GLUCOSE, UA: NEGATIVE
Leukocytes, UA: NEGATIVE
Nitrite, UA: NEGATIVE
PH UA: 5 (ref 5.0–7.5)
PROTEIN UA: NEGATIVE
Specific Gravity, UA: >1.03 — ABNORMAL HIGH (ref 1.005–1.030)
UUROB: 0.2 mg/dL (ref 0.2–1.0)

## 2016-10-06 NOTE — Patient Instructions (Signed)
Continue current medications. Continue good therapeutic lifestyle changes which include good diet and exercise. Fall precautions discussed with patient. If an FOBT was given today- please return it to our front desk. If you are over 50 years old - you may need Prevnar 13 or the adult Pneumonia vaccine.  **Flu shots are available--- please call and schedule a FLU-CLINIC appointment**  After your visit with us today you will receive a survey in the mail or online from [employer] regarding your care with us. Please take a moment to fill this out. Your feedback is very important to us as you can help us better understand your patient needs as well as improve your experience and satisfaction. WE CARE ABOUT YOU!!!    

## 2016-10-06 NOTE — Addendum Note (Signed)
Addended by: Zannie Cove on: 10/06/2016 12:00 PM   Modules accepted: Orders

## 2016-10-06 NOTE — Progress Notes (Signed)
   Subjective:    Patient ID: Kelsey Townsend, female    DOB: 10/01/1965, 51 y.o.   MRN: [id number]  HPI Pt here for follow up and management of chronic medical problems which includes hypertension. She is not currently taking any of her medications.  She has stopped work in curiously her headaches and blood pressure are in better control. She has not taken blood pressure pill for about 6 months and pressure today is good. She monitors her pressure at home. She is trying to participate in a knee pain study. They did a screening urinalysis and so protein in her urine. I explained that that may be related to her blood pressures that that we should probably check urinalysis today.  Patient Active Problem List   Diagnosis Date Noted  . Degenerative arthritis of right knee 11/14/2015  . Benign skin papilloma 05/21/2015  . Hypertension   . Migraines   . Hemorrhoids, internal, with bleeding 10/12/2011   Outpatient Encounter Prescriptions as of 10/06/2016  Medication Sig  . valsartan-hydrochlorothiazide (DIOVAN-HCT) 160-12.5 MG tablet TAKE 1 TABLET BY MOUTH DAILY. (Patient not taking: Reported on 12/18/2015)  . [DISCONTINUED] cholecalciferol (VITAMIN D) 1000 units tablet Take 1,000 Units by mouth daily.   No facility-administered encounter medications on file as of 10/06/2016.       Review of Systems  Constitutional: Negative.   HENT: Negative.   Eyes: Negative.   Respiratory: Negative.   Cardiovascular: Negative.   Gastrointestinal: Negative.   Endocrine: Negative.   Genitourinary: Negative.        Protein in urine  Musculoskeletal: Positive for arthralgias (right knee pain - following a trial study ).  Skin: Negative.   Allergic/Immunologic: Negative.   Neurological: Negative.   Hematological: Negative.   Psychiatric/Behavioral: Negative.        Objective:   Physical Exam  Constitutional: She is oriented to person, place, and time. She appears well-developed and well-nourished.    Cardiovascular: Normal rate, regular rhythm and normal heart sounds.   Pulmonary/Chest: Effort normal and breath sounds normal.  Musculoskeletal: Normal range of motion.  Neurological: She is alert and oriented to person, place, and time.   BP 129/81 (BP Location: Left Arm)   Pulse 81   Temp 98.5 F (36.9 C) (Oral)   Ht _0  (1.626 m)   Wt 168 lb (76.2 kg)   LMP 07/19/2014   SpO2 98%   BMI 28.84 kg/m         Assessment & Plan:  1. Essential hypertension Patient will continue to monitor pressure and use medicine if pressures are generally over 130/80.  Dip urinalysis shows 2+ blood but negative for protein. I would like to evaluate this hematuria with renal ultrasound. If there are any questions about those results may need CT urogram. - CMP14+EGFR - Lipid panel - Urinalysis  Wardell Honour MD

## 2016-10-12 ENCOUNTER — Ambulatory Visit
Admission: RE | Admit: 2016-10-12 | Discharge: 2016-10-12 | Disposition: A | Discharge status: Home / Self Care | Payer: 59 | Source: Ambulatory Visit | Attending: Family Medicine | Admitting: Family Medicine

## 2016-10-12 DIAGNOSIS — I10 Essential (primary) hypertension: Secondary | ICD-10-CM

## 2016-10-12 DIAGNOSIS — R319 Hematuria, unspecified: Secondary | ICD-10-CM

## 2016-10-13 ENCOUNTER — Telehealth: Payer: Self-pay | Admitting: Family Medicine

## 2016-10-13 NOTE — Telephone Encounter (Signed)
Aware. 

## 2017-01-08 ENCOUNTER — Ambulatory Visit: Payer: 59

## 2017-10-04 ENCOUNTER — Encounter: Payer: 59 | Admitting: Family Medicine

## 2017-10-05 ENCOUNTER — Encounter: Payer: Self-pay | Admitting: Family Medicine

## 2017-11-16 IMAGING — US US RENAL
1 series · 14 of 25 positions shown · non-contrast
Comparison: None.

CLINICAL DATA: Acute onset of hematuria. Essential hypertension.
Initial encounter.

EXAM:
RENAL / URINARY TRACT ULTRASOUND COMPLETE

[Series 1: us renal · 0.23mm/px · 14 of 42 slices shown]
[im 1/42]
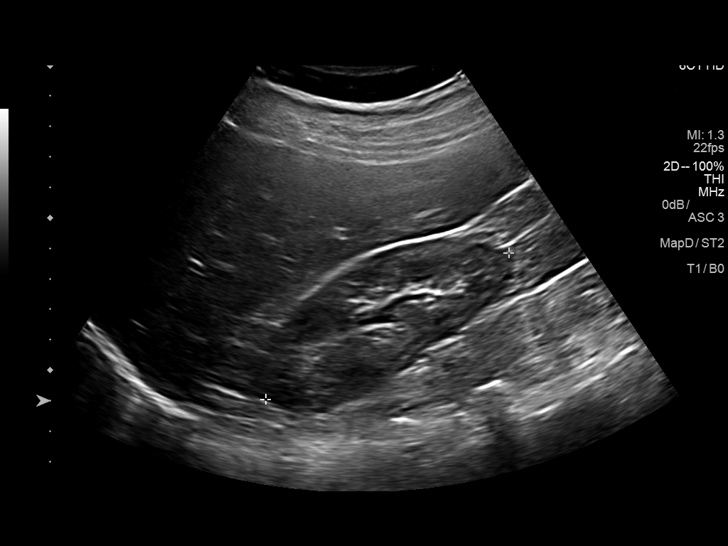
[im 4/42]
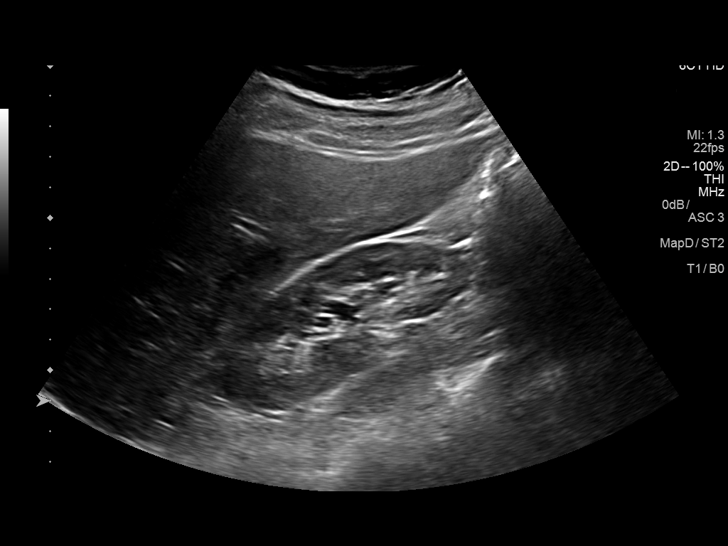
[im 7/42]
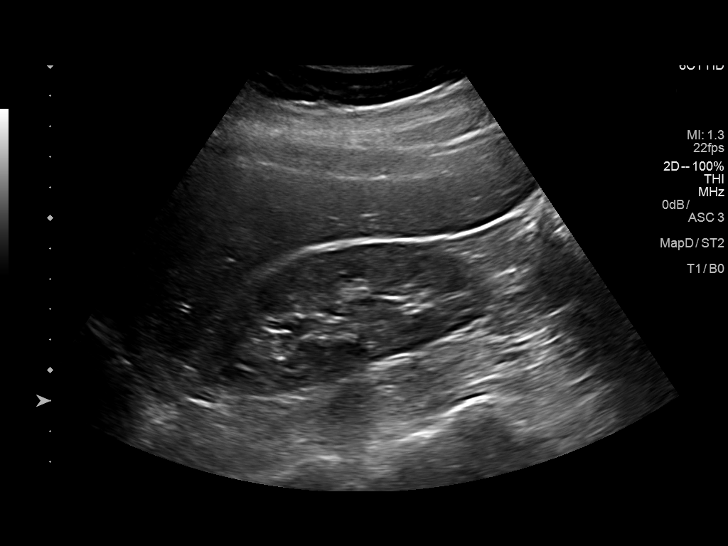
[im 11/42]
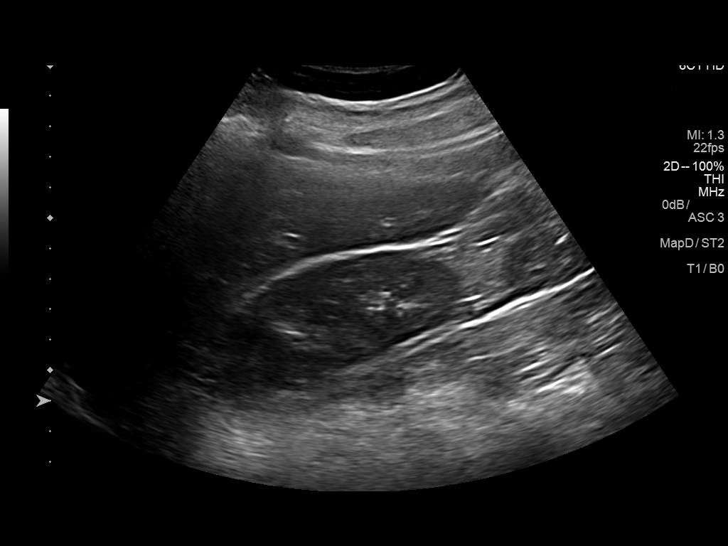
[im 14/42]
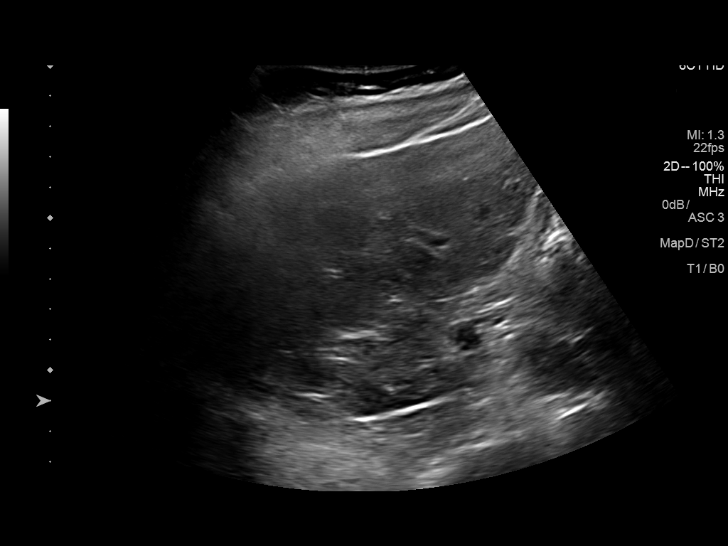
[im 16/42]
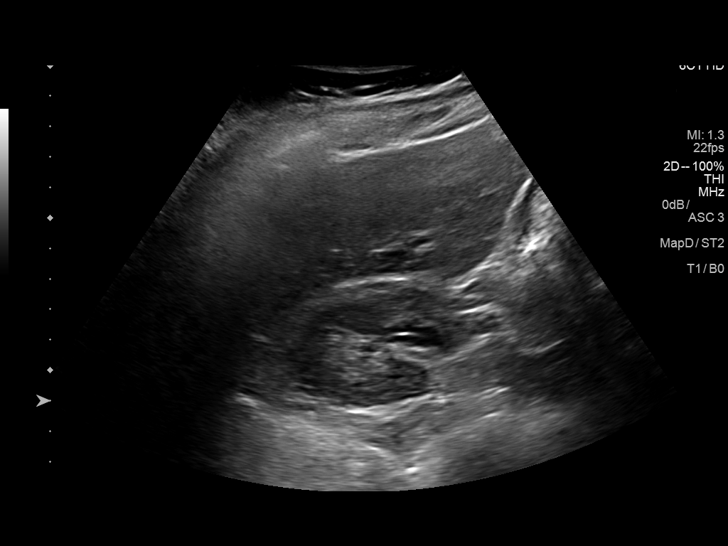
[im 19/42]
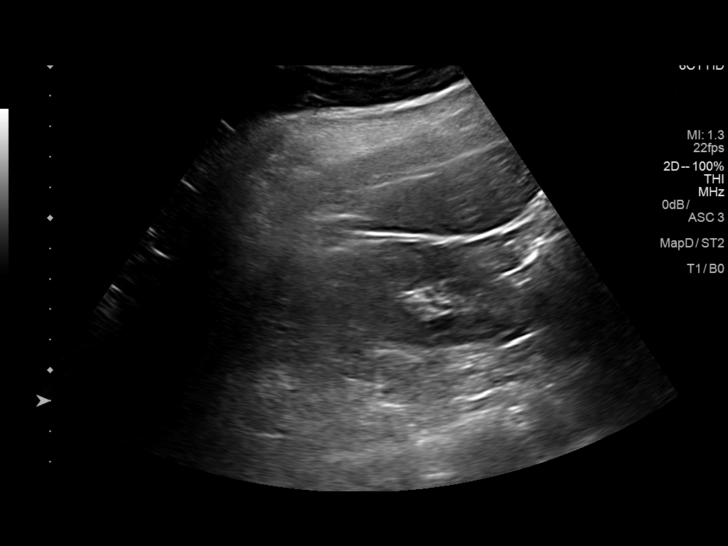
[im 23/42]
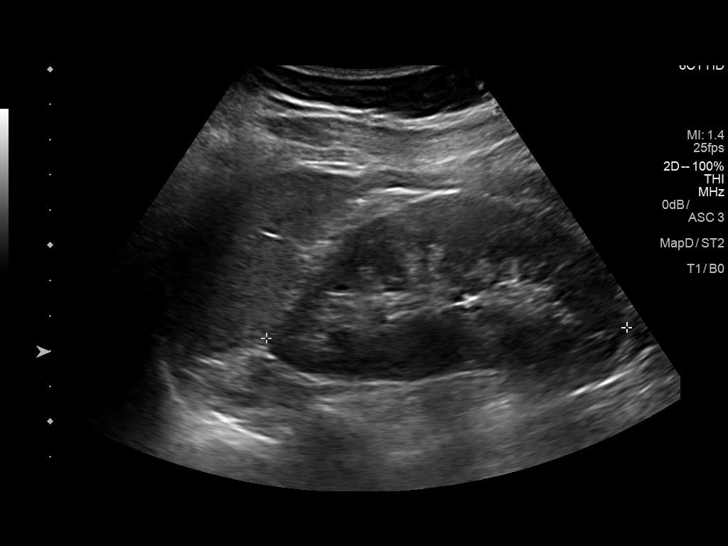
[im 26/42]
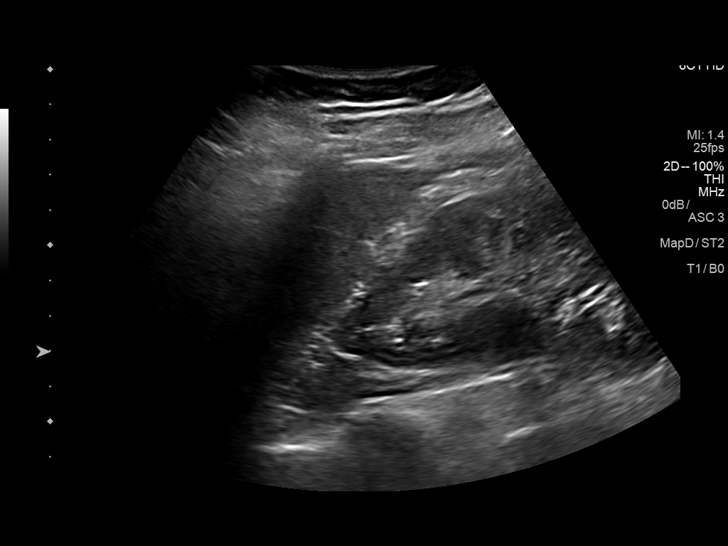
[im 28/42]
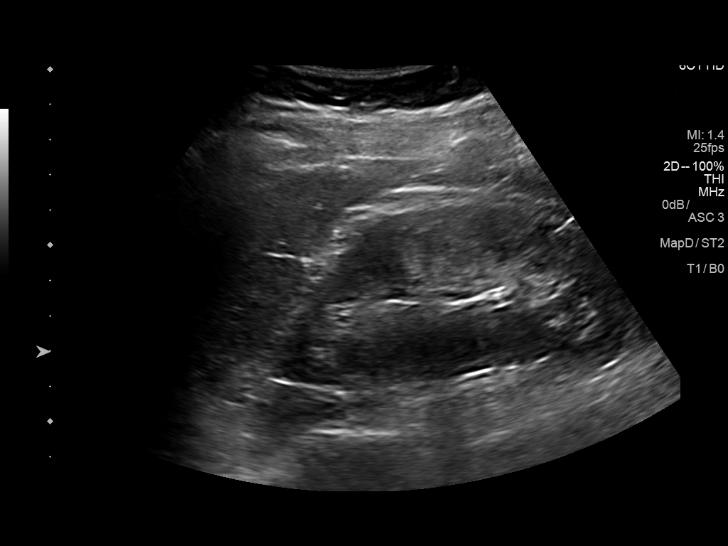
[im 31/42]
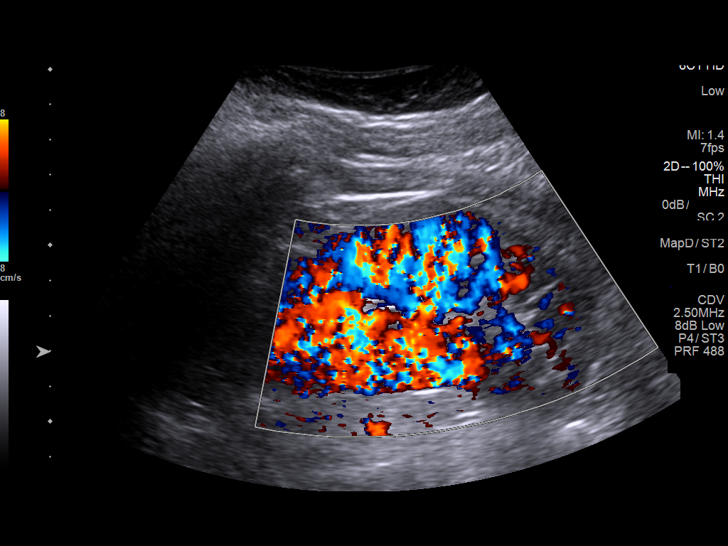
[im 35/42]
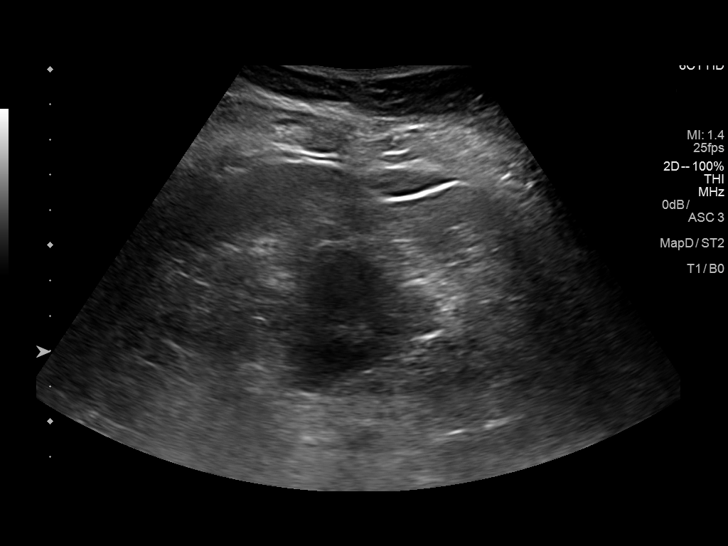
[im 38/42]
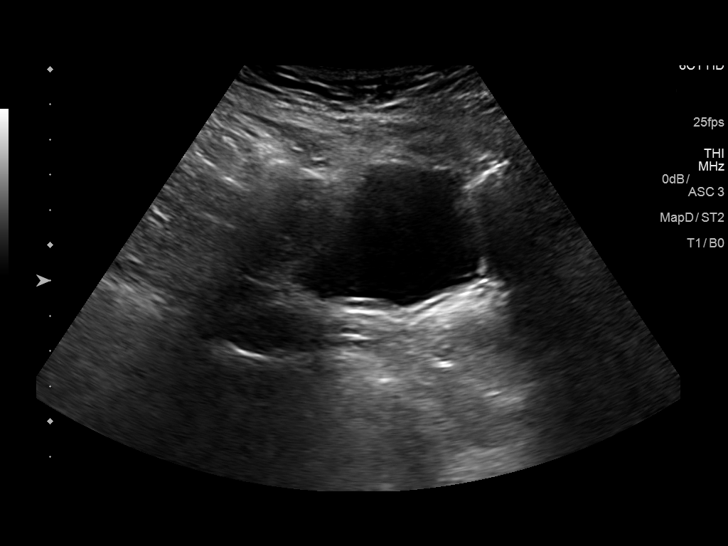
[im 42/42]
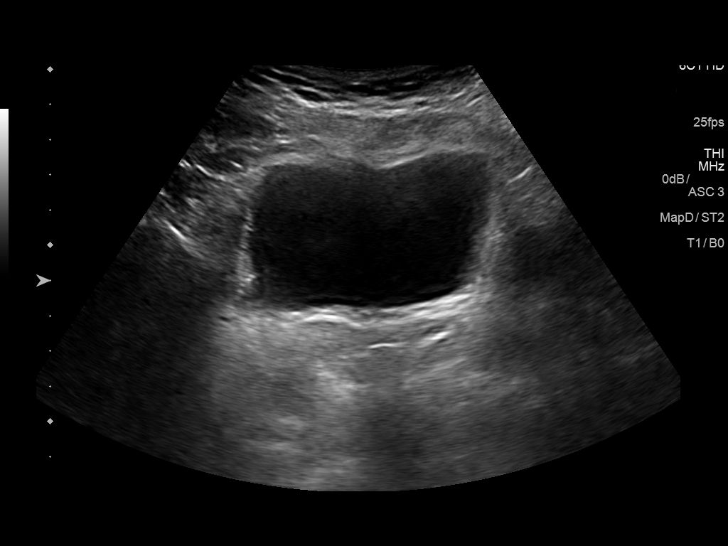

[14 of 25 positions shown; findings below may reference images not displayed]

FINDINGS: Right Kidney:

Length: 9.3 cm. Echogenicity within normal limits. No mass or
hydronephrosis visualized.

Left Kidney:

Length: 10.3 cm. Echogenicity within normal limits. No mass or
hydronephrosis visualized.

Bladder:

Appears normal for degree of bladder distention.
IMPRESSION: Unremarkable renal ultrasound.  No evidence of hydronephrosis.

## 2018-03-23 ENCOUNTER — Encounter: Payer: Self-pay | Admitting: Family Medicine

## 2018-03-23 ENCOUNTER — Ambulatory Visit (INDEPENDENT_AMBULATORY_CARE_PROVIDER_SITE_OTHER): Payer: 59 | Admitting: Family Medicine

## 2018-03-23 VITALS — BP 124/75 | HR 77 | Temp 98.2°F | Ht 64.0 in | Wt 147.2 lb

## 2018-03-23 DIAGNOSIS — J020 Streptococcal pharyngitis: Secondary | ICD-10-CM

## 2018-03-23 LAB — RAPID STREP SCREEN (MED CTR MEBANE ONLY): Strep Gp A Ag, IA W/Reflex: POSITIVE — AB

## 2018-03-23 MED ORDER — AMOXICILLIN 875 MG PO TABS: 875.0000 mg | ORAL_TABLET | Freq: Two times a day (BID) | ORAL | 0 refills | Status: AC

## 2018-03-23 NOTE — Progress Notes (Signed)
   HPI  Patient presents today here with sore throat.  Patient explains she has had 2 days of sore throat, chills, fatigue, subjective fever, body aches.  She is tolerating fluids by mouth, she has decreased appetite.  She has been taking NyQuil  PMH: Smoking status noted ROS: Per HPI  Objective: BP 124/75   Pulse 77   Temp 98.2 F (36.8 C) (Oral)   Ht 5\' 4"  (1.626 m)   Wt 147 lb 3.2 oz (66.8 kg)   LMP 07/19/2014   BMI 25.27 kg/m  Gen: NAD, alert, cooperative with exam HEENT: NCAT, oropharynx with enlarged tonsils, nares with enlarged turbinates, TMs normal bilaterally, oropharynx moist Neck: Left-sided tender lymphadenopathy in the anterior cervical chain CV: RRR, good S1/S2, no murmur Resp: CTABL, faint wheeze in the right lower lung field, non-labored Abd: SNTND, BS present, no guarding or organomegaly Ext: No edema, warm Neuro: Alert and oriented, No gross deficits  Assessment and plan:  #Strep pharyngitis Treat with amoxicillin Return to clinic with any concerns   Orders Placed This Encounter  Procedures  . Culture, Group A Strep    Order Specific Question:   Source    Answer:   throat  . Rapid Strep Screen ([facility] ONLY)    Meds ordered this encounter  Medications  . amoxicillin (AMOXIL) 875 MG tablet    Sig: Take 1 tablet (875 mg total) by mouth 2 (two) times daily.    Dispense:  20 tablet    Refill:  0    Laroy Apple, MD [facility] 03/23/2018, 1:25 PM

## 2018-03-23 NOTE — Patient Instructions (Signed)
Great to see you!   Strep Throat Strep throat is an infection of the throat. It is caused by germs. Strep throat spreads from person to person because of coughing, sneezing, or close contact. Follow these instructions at home: Medicines  Take over-the-counter and prescription medicines only as told by your doctor.  Take your antibiotic medicine as told by your doctor. Do not stop taking the medicine even if you feel better.  Have family members who also have a sore throat or fever go to a doctor. Eating and drinking  Do not share food, drinking cups, or personal items.  Try eating soft foods until your sore throat feels better.  Drink enough fluid to keep your pee (urine) clear or pale yellow. General instructions  Rinse your mouth (gargle) with a salt-water mixture 3-4 times per day or as needed. To make a salt-water mixture, stir -1 tsp of salt into 1 cup of warm water.  Make sure that all people in your house wash their hands well.  Rest.  Stay home from school or work until you have been taking antibiotics for 24 hours.  Keep all follow-up visits as told by your doctor. This is important. Contact a doctor if:  Your neck keeps getting bigger.  You get a rash, cough, or earache.  You cough up thick liquid that is green, yellow-brown, or bloody.  You have pain that does not get better with medicine.  Your problems get worse instead of getting better.  You have a fever. Get help right away if:  You throw up (vomit).  You get a very bad headache.  You neck hurts or it feels stiff.  You have chest pain or you are short of breath.  You have drooling, very bad throat pain, or changes in your voice.  Your neck is swollen or the skin gets red and tender.  Your mouth is dry or you are peeing less than normal.  You keep feeling more tired or it is hard to wake up.  Your joints are red or they hurt. This information is not intended to replace advice given to you  by your health care provider. Make sure you discuss any questions you have with your health care provider. Document Released: 02/03/2008 Document Revised: 04/15/2016 Document Reviewed: 12/10/2014 Elsevier Interactive Patient Education  2018 [employer]  

## 2022-12-30 ENCOUNTER — Ambulatory Visit: Payer: 59 | Admitting: Family Medicine

## 2022-12-31 ENCOUNTER — Encounter: Payer: Self-pay | Admitting: Family Medicine
